# Patient Record
Sex: Female | Born: 1935 | Hispanic: Yes | Smoking: Never smoker
Health system: Southern US, Community
[De-identification: ages and names within clinical notes are randomized; demographics above are authoritative.]

## PROBLEM LIST (undated history)

## (undated) DIAGNOSIS — H269 Unspecified cataract: Secondary | ICD-10-CM

## (undated) DIAGNOSIS — I10 Essential (primary) hypertension: Secondary | ICD-10-CM

## (undated) DIAGNOSIS — A159 Respiratory tuberculosis unspecified: Secondary | ICD-10-CM

## (undated) HISTORY — DX: Unspecified cataract: H26.9

## (undated) HISTORY — DX: Essential (primary) hypertension: I10

## (undated) HISTORY — DX: Respiratory tuberculosis unspecified: A15.9

---

## 2013-09-07 ENCOUNTER — Ambulatory Visit: Payer: Self-pay | Admitting: Family Medicine

## 2015-03-05 IMAGING — CR DG CHEST 1V
1 series · 1 of 1 positions shown · non-contrast
Comparison: None.

CLINICAL DATA: Positive tuberculin skin test

EXAM:
CHEST - 1 VIEW

[w chest pa]
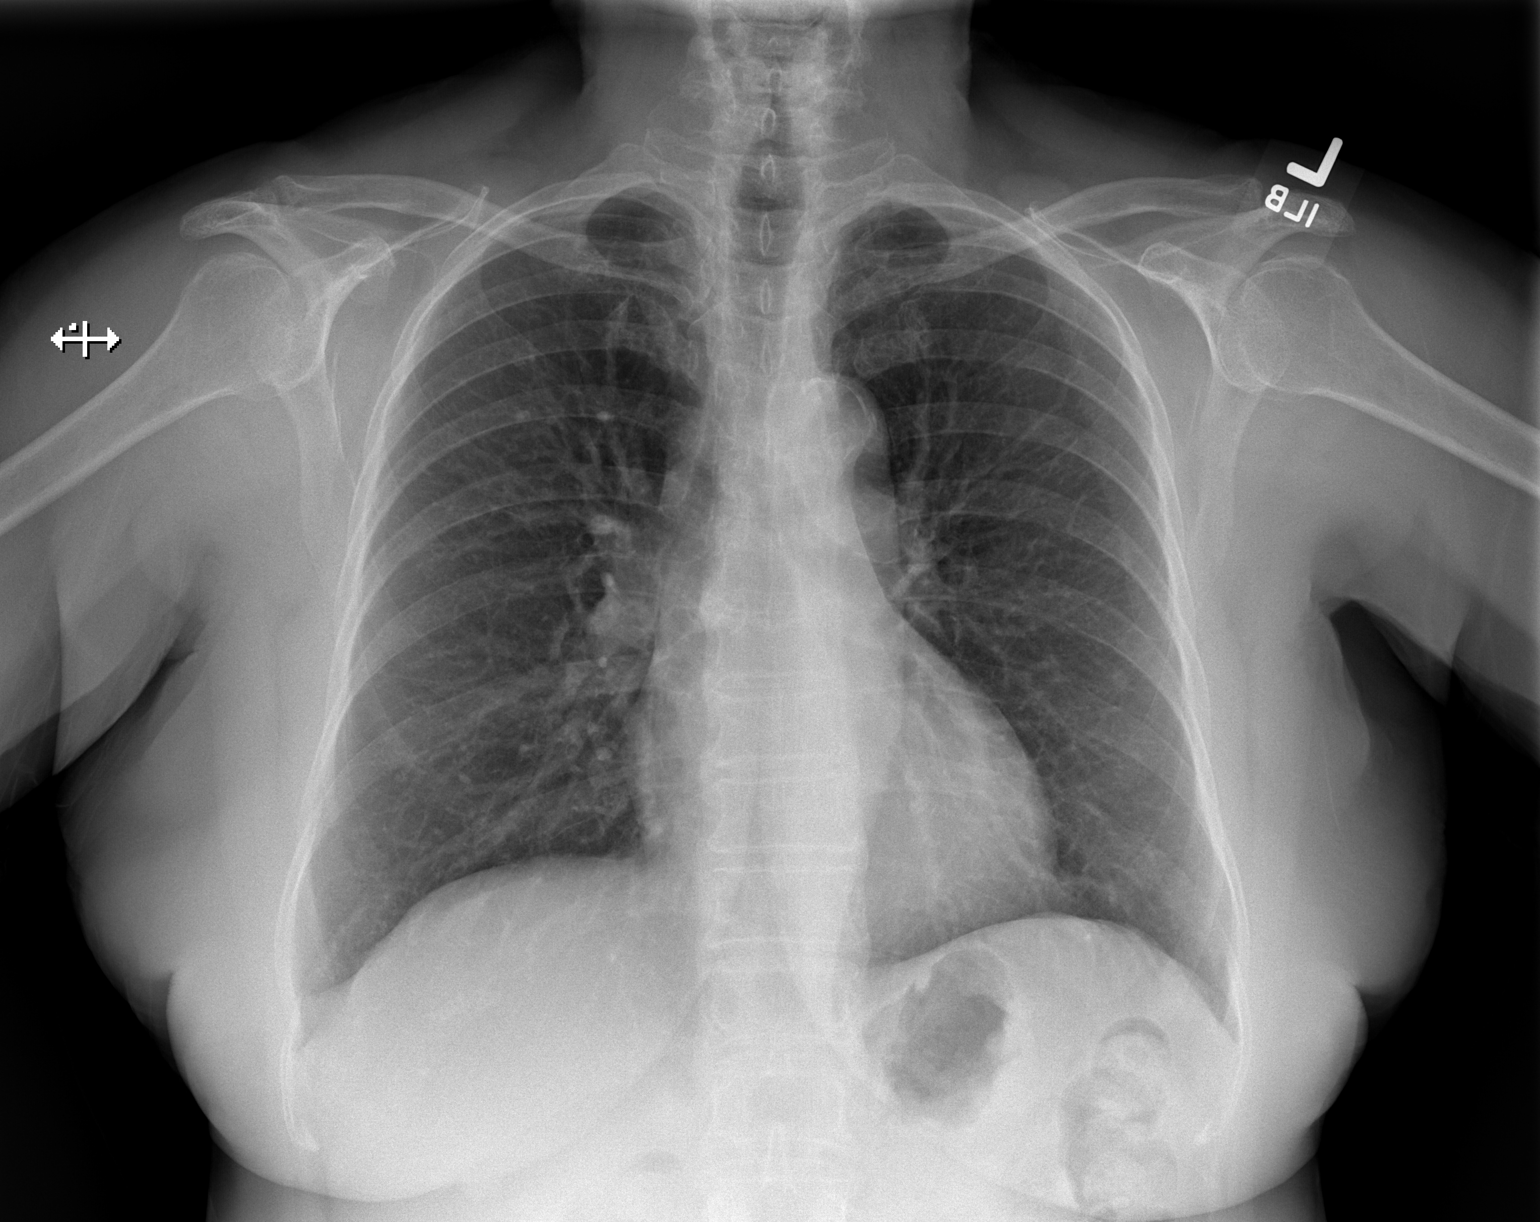

[1 of 1 positions shown; findings below may reference images not displayed]

FINDINGS: There is a small area of presumed scar in the left mid lung region
measuring 4 mm. Lungs are otherwise clear. Heart size and pulmonary
vascularity are normal. No adenopathy. There is atherosclerotic
change in the aorta. No bone lesions.
IMPRESSION: Small probable scar left mid lung. As there are no prior studies to
compare, a followup study in approximately 3 months to assess for
stability would be advisable to further evaluate. The lungs
elsewhere are clear. No adenopathy.

## 2015-03-16 ENCOUNTER — Other Ambulatory Visit: Payer: Self-pay

## 2015-03-16 LAB — HEPATIC FUNCTION PANEL
ALT: 15 U/L (ref 7–35)
AST: 18 U/L (ref 13–35)
Alkaline Phosphatase: 74 U/L (ref 25–125)
BILIRUBIN, TOTAL: 0.4 mg/dL

## 2015-03-16 LAB — BASIC METABOLIC PANEL
BUN: 22 mg/dL — AB (ref 4–21)
Creatinine: 0.8 mg/dL (ref 0.5–1.1)
GLUCOSE: 91 mg/dL
POTASSIUM: 4 mmol/L (ref 3.4–5.3)
Sodium: 140 mmol/L (ref 137–147)

## 2015-03-16 LAB — LIPID PANEL
Cholesterol: 131 mg/dL (ref 0–200)
HDL: 39 mg/dL (ref 35–70)
LDL CALC: 58 mg/dL
Triglycerides: 171 mg/dL — AB (ref 40–160)

## 2015-03-16 LAB — CBC AND DIFFERENTIAL
HCT: 41 % (ref 36–46)
Hemoglobin: 13.7 g/dL (ref 12.0–16.0)
Neutrophils Absolute: 5 /uL
Platelets: 158 10*3/uL (ref 150–399)
WBC: 9.4 10*3/mL

## 2015-03-16 LAB — HEMOGLOBIN A1C: Hemoglobin A1C: 6.4

## 2015-03-16 LAB — TSH: TSH: 3.53 u[IU]/mL (ref 0.41–5.90)

## 2015-03-23 ENCOUNTER — Ambulatory Visit: Payer: Self-pay

## 2015-03-23 DIAGNOSIS — R7303 Prediabetes: Secondary | ICD-10-CM | POA: Insufficient documentation

## 2015-09-11 DIAGNOSIS — I10 Essential (primary) hypertension: Secondary | ICD-10-CM | POA: Insufficient documentation

## 2015-09-11 DIAGNOSIS — H269 Unspecified cataract: Secondary | ICD-10-CM | POA: Insufficient documentation

## 2015-09-21 ENCOUNTER — Ambulatory Visit: Payer: Self-pay

## 2015-10-05 ENCOUNTER — Ambulatory Visit: Payer: Self-pay | Admitting: Ophthalmology

## 2016-01-31 DIAGNOSIS — I10 Essential (primary) hypertension: Secondary | ICD-10-CM

## 2016-01-31 DIAGNOSIS — H269 Unspecified cataract: Secondary | ICD-10-CM

## 2016-01-31 DIAGNOSIS — R7303 Prediabetes: Secondary | ICD-10-CM

## 2016-03-21 ENCOUNTER — Other Ambulatory Visit: Payer: Self-pay

## 2016-03-21 DIAGNOSIS — I10 Essential (primary) hypertension: Secondary | ICD-10-CM

## 2016-03-21 DIAGNOSIS — R7303 Prediabetes: Secondary | ICD-10-CM

## 2016-03-22 LAB — CBC WITH DIFFERENTIAL/PLATELET
Basophils Absolute: 0.1 10*3/uL (ref 0.0–0.2)
Basos: 1 %
EOS (ABSOLUTE): 0 10*3/uL (ref 0.0–0.4)
Eos: 0 %
HEMOGLOBIN: 14 g/dL (ref 11.1–15.9)
Hematocrit: 43.1 % (ref 34.0–46.6)
IMMATURE GRANULOCYTES: 0 %
Immature Grans (Abs): 0 10*3/uL (ref 0.0–0.1)
Lymphocytes Absolute: 3.5 10*3/uL — ABNORMAL HIGH (ref 0.7–3.1)
Lymphs: 45 %
MCH: 29.7 pg (ref 26.6–33.0)
MCHC: 32.5 g/dL (ref 31.5–35.7)
MCV: 91 fL (ref 79–97)
MONOS ABS: 0.7 10*3/uL (ref 0.1–0.9)
Monocytes: 9 %
NEUTROS PCT: 45 %
Neutrophils Absolute: 3.5 10*3/uL (ref 1.4–7.0)
Platelets: 173 10*3/uL (ref 150–379)
RBC: 4.72 x10E6/uL (ref 3.77–5.28)
RDW: 13.1 % (ref 12.3–15.4)
WBC: 7.9 10*3/uL (ref 3.4–10.8)

## 2016-03-22 LAB — COMPREHENSIVE METABOLIC PANEL
A/G RATIO: 1.6 (ref 1.2–2.2)
ALT: 25 IU/L (ref 0–32)
AST: 30 IU/L (ref 0–40)
Albumin: 4.4 g/dL (ref 3.5–4.7)
Alkaline Phosphatase: 68 IU/L (ref 39–117)
BILIRUBIN TOTAL: 0.3 mg/dL (ref 0.0–1.2)
BUN / CREAT RATIO: 32 — AB (ref 12–28)
BUN: 19 mg/dL (ref 8–27)
CALCIUM: 9.1 mg/dL (ref 8.7–10.3)
CO2: 24 mmol/L (ref 18–29)
Chloride: 94 mmol/L — ABNORMAL LOW (ref 96–106)
Creatinine, Ser: 0.6 mg/dL (ref 0.57–1.00)
GFR, EST AFRICAN AMERICAN: 98 mL/min/{1.73_m2} (ref >59)
GFR, EST NON AFRICAN AMERICAN: 85 mL/min/{1.73_m2} (ref >59)
GLOBULIN, TOTAL: 2.7 g/dL (ref 1.5–4.5)
Glucose: 93 mg/dL (ref 65–99)
POTASSIUM: 3.7 mmol/L (ref 3.5–5.2)
SODIUM: 135 mmol/L (ref 134–144)
TOTAL PROTEIN: 7.1 g/dL (ref 6.0–8.5)

## 2016-03-22 LAB — LIPID PANEL
CHOLESTEROL TOTAL: 128 mg/dL (ref 100–199)
Chol/HDL Ratio: 3.4 ratio units (ref 0.0–4.4)
HDL: 38 mg/dL — AB (ref >39)
LDL Calculated: 63 mg/dL (ref 0–99)
TRIGLYCERIDES: 134 mg/dL (ref 0–149)
VLDL Cholesterol Cal: 27 mg/dL (ref 5–40)

## 2016-03-28 ENCOUNTER — Ambulatory Visit: Payer: Self-pay | Admitting: Family Medicine

## 2016-03-28 VITALS — BP 160/73 | HR 65 | Resp 16 | Ht <= 58 in | Wt 110.0 lb

## 2016-03-28 DIAGNOSIS — N3090 Cystitis, unspecified without hematuria: Secondary | ICD-10-CM | POA: Insufficient documentation

## 2016-03-28 DIAGNOSIS — I10 Essential (primary) hypertension: Secondary | ICD-10-CM

## 2016-03-28 DIAGNOSIS — E785 Hyperlipidemia, unspecified: Secondary | ICD-10-CM

## 2016-03-28 MED ORDER — SIMVASTATIN 40 MG PO TABS: 40.0000 mg | ORAL_TABLET | Freq: Every day | ORAL | Status: DC

## 2016-03-28 MED ORDER — HYDROCHLOROTHIAZIDE 25 MG PO TABS: 25.0000 mg | ORAL_TABLET | Freq: Every day | ORAL | Status: DC

## 2016-03-28 MED ORDER — CIPROFLOXACIN HCL 250 MG PO TABS: 250.0000 mg | ORAL_TABLET | Freq: Two times a day (BID) | ORAL | Status: DC

## 2016-03-28 NOTE — Progress Notes (Signed)
       Patient: Melanie Stark Female    DOB: 27-Apr-1931   82 y.o.   MRN: [id number] Visit Date: 03/28/2016  Today's Provider: [facility]   Chief Complaint  Patient presents with  . Urinary Tract Infection  . Hypertension   Subjective:    HPI  Subjective:  Melanie Stark is a 82 y.o. female with hypertension here for recheck.   Doing well. Taking her medication regularly.  Needs medication regularly.  Feels well except as noted. Daughter here with her and is helping with translation.     Does also have some urine symptoms. Does have some frequency and burning.   No leaking. Does not have any fevers. Does not feel sick.    Current Outpatient Prescriptions  Medication Sig Dispense Refill  . hydrochlorothiazide (HYDRODIURIL) 25 MG tablet Take 25 mg by mouth daily. Take 1 and 1/2 tabs PO QD    . simvastatin (ZOCOR) 40 MG tablet Take 40 mg by mouth daily.     No current facility-administered medications for this visit.        No Known Allergies Previous Medications   HYDROCHLOROTHIAZIDE (HYDRODIURIL) 25 MG TABLET    Take 25 mg by mouth daily. Take 1 and 1/2 tabs PO QD   SIMVASTATIN (ZOCOR) 40 MG TABLET    Take 40 mg by mouth daily.    Review of Systems  Social History  Substance Use Topics  . Smoking status: Never Smoker   . Smokeless tobacco: Not on file  . Alcohol Use: No   Objective:   BP 160/73 mmHg  Pulse 65  Resp 16  Ht 4\' 8"  (1.422 m)  Wt 110 lb (49.896 kg)  BMI 24.68 kg/m2  LMP  (LMP Unknown)  Physical Exam  Constitutional: She is oriented to person, place, and time. She appears well-developed and well-nourished.  Cardiovascular: Normal rate and regular rhythm.   Pulmonary/Chest: Effort normal and breath sounds normal.  Abdominal: Soft. Bowel sounds are normal. There is no tenderness. There is no rebound and no guarding.  Neurological: She is alert and oriented to person, place, and time.      Assessment & Plan:     1. Essential  hypertension Condition is stable. Please continue current medication and  plan of care as noted.  Up slightly today. Will adjust if needed.   - hydrochlorothiazide (HYDRODIURIL) 25 MG tablet; Take 1 tablet (25 mg total) by mouth daily. Take 1 and 1/2 tabs PO QD  Dispense: 135 tablet; Refill: 3  2. Hyperlipemia Condition is stable. Please continue current medication and  plan of care as noted.   - simvastatin (ZOCOR) 40 MG tablet; Take 1 tablet (40 mg total) by mouth daily.  Dispense: 90 tablet; Refill: 3  3. Cystitis New problem. Will send for culture. Treat today.  Patient instructed to call back if condition worsens or does not improve.    - ciprofloxacin (CIPRO) 250 MG tablet; Take 1 tablet (250 mg total) by mouth 2 (two) times daily.  Dispense: 10 tablet; Refill: 0 - CULTURE, URINE COMPREHENSIVE     Lorie PhenixNancy Jonie Burdell, MD   [facility]   [facility]

## 2016-04-03 LAB — CULTURE, URINE COMPREHENSIVE

## 2016-04-05 ENCOUNTER — Telehealth: Payer: Self-pay

## 2016-04-05 DIAGNOSIS — N3000 Acute cystitis without hematuria: Secondary | ICD-10-CM

## 2016-04-05 MED ORDER — SULFAMETHOXAZOLE-TRIMETHOPRIM 800-160 MG PO TABS: 1.0000 | ORAL_TABLET | Freq: Two times a day (BID) | ORAL | Status: DC

## 2016-04-09 NOTE — Telephone Encounter (Signed)
That's fine

## 2016-04-09 NOTE — Telephone Encounter (Signed)
Spoke with patients daughter she states the patient was given an antibiotic at her 03/28/16 visit and she is feeling better. I [facility] to [facility] antibiotic [facility] on Friday and it did not go through. If she still needs another round of antibiotics [facility] [place].

## 2016-05-16 ENCOUNTER — Ambulatory Visit: Payer: Self-pay

## 2016-05-30 ENCOUNTER — Ambulatory Visit: Payer: Self-pay | Admitting: Family Medicine

## 2016-05-30 VITALS — BP 140/75 | Wt 110.0 lb

## 2016-05-30 DIAGNOSIS — M1710 Unilateral primary osteoarthritis, unspecified knee: Secondary | ICD-10-CM

## 2016-05-30 MED ORDER — MELOXICAM 7.5 MG PO TABS: 7.5000 mg | ORAL_TABLET | Freq: Every day | ORAL | 1 refills | Status: DC

## 2016-05-30 MED ORDER — OMEPRAZOLE 20 MG PO CPDR: 20.0000 mg | DELAYED_RELEASE_CAPSULE | Freq: Every day | ORAL | 1 refills | Status: DC

## 2016-05-30 NOTE — Progress Notes (Signed)
   Subjective:    Patient ID: Melanie Stark, female    DOB: Jan 19, 1931, 82 y.o.   MRN: [id number]  HPI  Patient had uti and took full antibiotic course in past 3 months Possible tendinopathy in knees Also pain in legs Takes ibuprofen for pain   Patient Active Problem List   Diagnosis Date Noted  . Hyperlipemia 03/28/2016  . Cystitis 03/28/2016  . Cataracts, bilateral 09/11/2015  . Hypertension 09/11/2015  . Prediabetes 03/23/2015        Review of Systems  Constitutional: Negative.   Respiratory: Negative.   Cardiovascular: Negative.   Endocrine: Negative.   Musculoskeletal: Positive for arthralgias and joint swelling.  Psychiatric/Behavioral: Negative.        Objective:   Physical Exam  Constitutional: She appears well-developed and well-nourished.  HENT:  Head: Normocephalic and atraumatic.  Eyes: Conjunctivae are normal. No scleral icterus.  Cardiovascular: Normal rate, regular rhythm and normal heart sounds.   Pulmonary/Chest: Effort normal and breath sounds normal.  Abdominal: Soft.  Skin: Skin is warm and dry.  Psychiatric: She has a normal mood and affect. Her behavior is normal. Judgment and thought content normal.    BP 140/75   Wt 110 lb (49.9 kg)   LMP  (LMP Unknown)   BMI 24.66 kg/m   Pain mostly in left knee     Assessment & Plan:  Knee pain/OA Added medications: meloxican for 1-2 months. Omeprazole for GI protection. Prediabetes Hypertension Hyperlipidemia I have done the exam and reviewed the above chart and it is accurate to the best of my knowledge. Fidela Juneau MD Follow up PRN

## 2016-05-30 NOTE — Patient Instructions (Signed)
Follow up PRN

## 2016-09-19 ENCOUNTER — Ambulatory Visit: Payer: Self-pay | Admitting: Adult Health Nurse Practitioner

## 2016-09-19 VITALS — BP 133/65 | HR 77 | Temp 97.9°F | Resp 16 | Ht <= 58 in | Wt 108.0 lb

## 2016-09-19 DIAGNOSIS — E785 Hyperlipidemia, unspecified: Secondary | ICD-10-CM

## 2016-09-19 DIAGNOSIS — I10 Essential (primary) hypertension: Secondary | ICD-10-CM

## 2016-09-19 DIAGNOSIS — Z0000 Encounter for general adult medical examination without abnormal findings: Secondary | ICD-10-CM

## 2016-09-19 MED ORDER — SIMVASTATIN 40 MG PO TABS: 40.0000 mg | ORAL_TABLET | Freq: Every day | ORAL | 3 refills | Status: DC

## 2016-09-19 MED ORDER — HYDROCHLOROTHIAZIDE 25 MG PO TABS: 25.0000 mg | ORAL_TABLET | Freq: Every day | ORAL | 3 refills | Status: DC

## 2016-09-19 NOTE — Progress Notes (Signed)
  Patient: Melanie NeighborsMaria Stark Female    DOB: Mar 29, 1931   82 y.o.   MRN: [id number] Visit Date: 09/19/2016  Today's Provider: Jacelyn Pieah Doles-Johnson, NP   No chief complaint on file.  Subjective:    HPI  HLD:  On Zocor 40mg  daily.  Denies myalgias or abdominal pain.   HTN:  On HCTZ 25 daily.  BP at home 130-135 SBP.  Denies HA, dizziness, CP.     No Known Allergies Previous Medications   HYDROCHLOROTHIAZIDE (HYDRODIURIL) 25 MG TABLET    Take 1 tablet (25 mg total) by mouth daily. Take 1 and 1/2 tabs PO QD   SIMVASTATIN (ZOCOR) 40 MG TABLET    Take 1 tablet (40 mg total) by mouth daily.    Review of Systems  All other systems reviewed and are negative.   Social History  Substance Use Topics  . Smoking status: Never Smoker  . Smokeless tobacco: Never Used  . Alcohol use No   Objective:   BP 133/65   Pulse 77   Temp 97.9 F (36.6 C) (Oral)   Resp 16   Ht 4\' 10"  (1.473 m)   Wt 108 lb (49 kg)   LMP  (LMP Unknown)   BMI 22.57 kg/m   Physical Exam  Constitutional: She is oriented to person, place, and time. She appears well-developed and well-nourished.  HENT:  Head: Normocephalic and atraumatic.  Eyes: Pupils are equal, round, and reactive to light.  Neck: Normal range of motion. Neck supple.  Cardiovascular: Normal rate, regular rhythm and normal heart sounds.   Pulmonary/Chest: Effort normal and breath sounds normal.  Abdominal: Soft. Bowel sounds are normal.  Neurological: She is alert and oriented to person, place, and time.  Skin: Skin is warm and dry.  Psychiatric: She has a normal mood and affect.  Vitals reviewed.       Assessment & Plan:     Order routine labs tonight.   HTN:  Controlled.  Goal BP <150/90.  Continue current medication regimen.  Encourage low salt diet and exercise.   HLD:  Will check lipid panel and LFTs tonight.  Continue current regimen.  Encourage low cholesterol, low fat diet and exercise.   FU in 6 months.       Jacelyn Pieah  Doles-Johnson, NP   [facility]

## 2016-09-20 LAB — CBC
HEMOGLOBIN: 14.4 g/dL (ref 11.1–15.9)
Hematocrit: 42.9 % (ref 34.0–46.6)
MCH: 30.4 pg (ref 26.6–33.0)
MCHC: 33.6 g/dL (ref 31.5–35.7)
MCV: 91 fL (ref 79–97)
Platelets: 163 10*3/uL (ref 150–379)
RBC: 4.74 x10E6/uL (ref 3.77–5.28)
RDW: 13 % (ref 12.3–15.4)
WBC: 7.2 10*3/uL (ref 3.4–10.8)

## 2016-09-20 LAB — LIPID PANEL
CHOLESTEROL TOTAL: 144 mg/dL (ref 100–199)
Chol/HDL Ratio: 3.3 ratio units (ref 0.0–4.4)
HDL: 43 mg/dL (ref >39)
LDL CALC: 69 mg/dL (ref 0–99)
TRIGLYCERIDES: 160 mg/dL — AB (ref 0–149)
VLDL Cholesterol Cal: 32 mg/dL (ref 5–40)

## 2016-09-20 LAB — COMPREHENSIVE METABOLIC PANEL
A/G RATIO: 1.7 (ref 1.2–2.2)
ALK PHOS: 95 IU/L (ref 39–117)
ALT: 18 IU/L (ref 0–32)
AST: 25 IU/L (ref 0–40)
Albumin: 4.7 g/dL (ref 3.5–4.7)
BILIRUBIN TOTAL: 0.4 mg/dL (ref 0.0–1.2)
BUN/Creatinine Ratio: 23 (ref 12–28)
BUN: 18 mg/dL (ref 8–27)
CHLORIDE: 100 mmol/L (ref 96–106)
CO2: 21 mmol/L (ref 18–29)
Calcium: 9.6 mg/dL (ref 8.7–10.3)
Creatinine, Ser: 0.79 mg/dL (ref 0.57–1.00)
GFR calc non Af Amer: 69 mL/min/{1.73_m2} (ref >59)
GFR, EST AFRICAN AMERICAN: 80 mL/min/{1.73_m2} (ref >59)
Globulin, Total: 2.7 g/dL (ref 1.5–4.5)
Glucose: 93 mg/dL (ref 65–99)
POTASSIUM: 4.7 mmol/L (ref 3.5–5.2)
Sodium: 141 mmol/L (ref 134–144)
TOTAL PROTEIN: 7.4 g/dL (ref 6.0–8.5)

## 2016-09-20 LAB — HEMOGLOBIN A1C
Est. average glucose Bld gHb Est-mCnc: 126 mg/dL
Hgb A1c MFr Bld: 6 % — ABNORMAL HIGH (ref 4.8–5.6)

## 2016-09-20 LAB — TSH: TSH: 3.64 u[IU]/mL (ref 0.450–4.500)

## 2017-03-20 ENCOUNTER — Ambulatory Visit: Payer: Self-pay | Admitting: Adult Health Nurse Practitioner

## 2017-03-20 VITALS — BP 153/68 | HR 65 | Temp 98.2°F | Wt 107.8 lb

## 2017-03-20 DIAGNOSIS — E785 Hyperlipidemia, unspecified: Secondary | ICD-10-CM

## 2017-03-20 DIAGNOSIS — I10 Essential (primary) hypertension: Secondary | ICD-10-CM

## 2017-03-20 MED ORDER — SIMVASTATIN 40 MG PO TABS: 40.0000 mg | ORAL_TABLET | Freq: Every day | ORAL | 3 refills | Status: DC

## 2017-03-20 MED ORDER — HYDROCHLOROTHIAZIDE 25 MG PO TABS: 25.0000 mg | ORAL_TABLET | Freq: Every day | ORAL | 3 refills | Status: DC

## 2017-03-20 NOTE — Addendum Note (Signed)
Addended by: Orvis BrillGREEN, Westin Knotts M on: 03/20/2017 06:23 PM   Modules accepted: Orders

## 2017-03-20 NOTE — Progress Notes (Signed)
  Patient: Melanie NeighborsMaria Stark Female    DOB: [birth year]   83 y.o.   MRN: [id number] Visit Date: 03/20/2017  Today's Provider: [facility]   Chief Complaint  Patient presents with  . Follow-up    6 months    Subjective:    HPI  HTN:  BP elevated today. Family believes pt is only taking 1 tab of HCTZ and not the 1/2 tab.  Last BP was 133/65.  BP at home running 148-150 SBP per family.   HLD:  Taking medications as directed.  Last LDL 69 in Nov 2017.      No Known Allergies Previous Medications   HYDROCHLOROTHIAZIDE (HYDRODIURIL) 25 MG TABLET    Take 1 tablet (25 mg total) by mouth daily. Take 1 and 1/2 tabs PO QD   SIMVASTATIN (ZOCOR) 40 MG TABLET    Take 1 tablet (40 mg total) by mouth daily.    Review of Systems  All other systems reviewed and are negative.   Social History  Substance Use Topics  . Smoking status: Never Smoker  . Smokeless tobacco: Never Used  . Alcohol use No   Objective:   BP (!) 153/68   Pulse 65   Temp 98.2 F (36.8 C)   Wt 107 lb 12.8 oz (48.9 kg)   LMP  (LMP Unknown)   BMI 22.53 kg/m   Physical Exam  Constitutional: She appears well-developed and well-nourished.  HENT:  Head: Normocephalic and atraumatic.  Neck: Normal range of motion. Neck supple.  Cardiovascular: Normal rate, regular rhythm and normal heart sounds.   Pulmonary/Chest: Effort normal and breath sounds normal.  Abdominal: Soft. Bowel sounds are normal.  Skin: Skin is warm and dry.  Vitals reviewed.       Assessment & Plan:         HLD:  Controlled.  Continue current regimen.  Encourage low cholesterol, low fat diet and exercise.    HTN:  Borderline.  Goal BP <150/90.  Continue current medication regimen.  Encourage low salt diet and exercise.  Discussed need to call if SBP sustained >160 and taking the correct dosage of medications.        [facility]   [facility]

## 2017-03-21 LAB — COMPREHENSIVE METABOLIC PANEL
ALBUMIN: 4.4 g/dL (ref 3.5–4.7)
ALT: 25 IU/L (ref 0–32)
AST: 26 IU/L (ref 0–40)
Albumin/Globulin Ratio: 1.7 (ref 1.2–2.2)
Alkaline Phosphatase: 79 IU/L (ref 39–117)
BUN / CREAT RATIO: 27 (ref 12–28)
BUN: 20 mg/dL (ref 8–27)
Bilirubin Total: 0.5 mg/dL (ref 0.0–1.2)
CO2: 27 mmol/L (ref 18–29)
CREATININE: 0.73 mg/dL (ref 0.57–1.00)
Calcium: 9.6 mg/dL (ref 8.7–10.3)
Chloride: 97 mmol/L (ref 96–106)
GFR calc Af Amer: 88 mL/min/{1.73_m2} (ref >59)
GFR calc non Af Amer: 76 mL/min/{1.73_m2} (ref >59)
GLOBULIN, TOTAL: 2.6 g/dL (ref 1.5–4.5)
Glucose: 105 mg/dL — ABNORMAL HIGH (ref 65–99)
Potassium: 3.8 mmol/L (ref 3.5–5.2)
SODIUM: 141 mmol/L (ref 134–144)
Total Protein: 7 g/dL (ref 6.0–8.5)

## 2017-09-18 ENCOUNTER — Other Ambulatory Visit: Payer: Self-pay

## 2017-09-18 ENCOUNTER — Ambulatory Visit: Payer: Self-pay | Admitting: Family Medicine

## 2017-09-18 VITALS — BP 140/60 | HR 64 | Temp 97.5°F | Wt 109.6 lb

## 2017-09-18 DIAGNOSIS — E785 Hyperlipidemia, unspecified: Secondary | ICD-10-CM

## 2017-09-18 DIAGNOSIS — Z09 Encounter for follow-up examination after completed treatment for conditions other than malignant neoplasm: Secondary | ICD-10-CM

## 2017-09-18 DIAGNOSIS — I10 Essential (primary) hypertension: Secondary | ICD-10-CM

## 2017-09-18 MED ORDER — HYDROCHLOROTHIAZIDE 25 MG PO TABS: 25.0000 mg | ORAL_TABLET | Freq: Every day | ORAL | 3 refills | Status: DC

## 2017-09-18 MED ORDER — SIMVASTATIN 40 MG PO TABS: 40.0000 mg | ORAL_TABLET | Freq: Every day | ORAL | 3 refills | Status: DC

## 2017-09-18 NOTE — Progress Notes (Signed)
  Patient: Melanie NeighborsMaria Stark Female    DOB: [birth year]   83 y.o.   MRN: [id number] Visit Date: 09/18/2017  Today's Provider: Kallie LocksNatalie M Jemina Scahill, FNP   Chief Complaint  Patient presents with  . Follow-up    for hypertension    Subjective:    HPI: Patient is here today for follow up in Hypertension and Hyperlipidemia. She states that she is doing well with no complaints. Continues to eat a healthy diet. No complaints of shortness of breath, cough, chest pain. Denies abdominal pain, diarrhea, constipation, nausea, vomiting, blood in stool, or hematuria.  Denies fevers, night sweats, and unintentional weight loss.     No Known Allergies  Current Outpatient Medications:  .  hydrochlorothiazide (HYDRODIURIL) 25 MG tablet, Take 1 tablet (25 mg total) daily by mouth. Take 1 and 1/2 tabs PO QD, Disp: 135 tablet, Rfl: 3 .  simvastatin (ZOCOR) 40 MG tablet, Take 1 tablet (40 mg total) daily by mouth., Disp: 90 tablet, Rfl: 3  Review of Systems  Constitutional: Negative.   HENT: Negative.   Eyes: Negative.   Respiratory: Negative.   Cardiovascular: Negative.   Gastrointestinal: Negative.   Endocrine: Negative.   Genitourinary: Negative.   Musculoskeletal: Negative.   Skin: Negative.   Allergic/Immunologic: Negative.   Neurological: Negative.   Hematological: Negative.   Psychiatric/Behavioral: Negative.   All other systems reviewed and are negative.     Social History   Tobacco Use  . Smoking status: Never Smoker  . Smokeless tobacco: Never Used  Substance Use Topics  . Alcohol use: No   Objective:   BP 140/60   Pulse 64   Temp (!) 97.5 F (36.4 C)   Wt 109 lb 9.6 oz (49.7 kg)   LMP  (LMP Unknown)   BMI 22.91 kg/m   Physical Exam  Constitutional: She is oriented to person, place, and time. She appears well-developed and well-nourished.  HENT:  Head: Normocephalic and atraumatic.  Right Ear: External ear normal.  Left Ear: External ear normal.  Mouth/Throat: Oropharynx  is clear and moist.  Neck: Normal range of motion.  Cardiovascular: Normal rate, regular rhythm, normal heart sounds and intact distal pulses.  Pulmonary/Chest: Effort normal and breath sounds normal.  Abdominal: Soft. Bowel sounds are normal.  Musculoskeletal: Normal range of motion.  Neurological: She is alert and oriented to person, place, and time.  Skin: Skin is warm and dry.  Psychiatric: She has a normal mood and affect. Her behavior is normal. Judgment and thought content normal.  Nursing note and vitals reviewed.       Assessment & Plan:   1. Hypertension: Stable.  BP is 140/60 today. Continue healthy diet, exercise. CBC, CMET, Lipid panels drawn today. Will call patient if abnormal.   2. Hyperlipidemia: See # 1.  3. Follow up: 6 months OV/Labs Patient to call office sooner if needed.     Kallie LocksNatalie M Taliya Mcclard, FNP   [facility]

## 2017-09-22 LAB — CBC
HEMOGLOBIN: 14.6 g/dL (ref 11.1–15.9)
Hematocrit: 42 % (ref 34.0–46.6)
MCH: 31.1 pg (ref 26.6–33.0)
MCHC: 34.8 g/dL (ref 31.5–35.7)
MCV: 89 fL (ref 79–97)
PLATELETS: 174 10*3/uL (ref 150–379)
RBC: 4.7 x10E6/uL (ref 3.77–5.28)
RDW: 13.2 % (ref 12.3–15.4)
WBC: 8.3 10*3/uL (ref 3.4–10.8)

## 2017-09-22 LAB — COMPREHENSIVE METABOLIC PANEL
ALT: 18 IU/L (ref 0–32)
AST: 24 IU/L (ref 0–40)
Albumin/Globulin Ratio: 1.6 (ref 1.2–2.2)
Albumin: 4.5 g/dL (ref 3.5–4.7)
Alkaline Phosphatase: 82 IU/L (ref 39–117)
BUN/Creatinine Ratio: 24 (ref 12–28)
BUN: 21 mg/dL (ref 8–27)
Bilirubin Total: 0.4 mg/dL (ref 0.0–1.2)
CALCIUM: 9.8 mg/dL (ref 8.7–10.3)
CHLORIDE: 98 mmol/L (ref 96–106)
CO2: 26 mmol/L (ref 20–29)
CREATININE: 0.88 mg/dL (ref 0.57–1.00)
GFR, EST AFRICAN AMERICAN: 70 mL/min/{1.73_m2} (ref >59)
GFR, EST NON AFRICAN AMERICAN: 61 mL/min/{1.73_m2} (ref >59)
GLUCOSE: 103 mg/dL — AB (ref 65–99)
Globulin, Total: 2.9 g/dL (ref 1.5–4.5)
Potassium: 4 mmol/L (ref 3.5–5.2)
Sodium: 140 mmol/L (ref 134–144)
TOTAL PROTEIN: 7.4 g/dL (ref 6.0–8.5)

## 2017-09-23 LAB — LIPID PANEL
Chol/HDL Ratio: 3 ratio (ref 0.0–4.4)
Cholesterol, Total: 134 mg/dL (ref 100–199)
HDL: 44 mg/dL (ref >39)
LDL Calculated: 68 mg/dL (ref 0–99)
Triglycerides: 109 mg/dL (ref 0–149)
VLDL Cholesterol Cal: 22 mg/dL (ref 5–40)

## 2018-03-12 ENCOUNTER — Other Ambulatory Visit: Payer: Self-pay

## 2018-03-12 DIAGNOSIS — Z0000 Encounter for general adult medical examination without abnormal findings: Secondary | ICD-10-CM

## 2018-03-13 LAB — CBC
HEMATOCRIT: 41.2 % (ref 34.0–46.6)
Hemoglobin: 14.2 g/dL (ref 11.1–15.9)
MCH: 30.9 pg (ref 26.6–33.0)
MCHC: 34.5 g/dL (ref 31.5–35.7)
MCV: 90 fL (ref 79–97)
PLATELETS: 155 10*3/uL (ref 150–379)
RBC: 4.6 x10E6/uL (ref 3.77–5.28)
RDW: 13.2 % (ref 12.3–15.4)
WBC: 8.1 10*3/uL (ref 3.4–10.8)

## 2018-03-13 LAB — COMPREHENSIVE METABOLIC PANEL
ALK PHOS: 83 IU/L (ref 39–117)
ALT: 15 IU/L (ref 0–32)
AST: 22 IU/L (ref 0–40)
Albumin/Globulin Ratio: 1.7 (ref 1.2–2.2)
Albumin: 4.4 g/dL (ref 3.5–4.7)
BILIRUBIN TOTAL: 0.3 mg/dL (ref 0.0–1.2)
BUN / CREAT RATIO: 22 (ref 12–28)
BUN: 15 mg/dL (ref 8–27)
CHLORIDE: 93 mmol/L — AB (ref 96–106)
CO2: 25 mmol/L (ref 20–29)
CREATININE: 0.68 mg/dL (ref 0.57–1.00)
Calcium: 9.1 mg/dL (ref 8.7–10.3)
GFR calc Af Amer: 93 mL/min/{1.73_m2} (ref >59)
GFR calc non Af Amer: 81 mL/min/{1.73_m2} (ref >59)
GLUCOSE: 89 mg/dL (ref 65–99)
Globulin, Total: 2.6 g/dL (ref 1.5–4.5)
Potassium: 3.4 mmol/L — ABNORMAL LOW (ref 3.5–5.2)
SODIUM: 133 mmol/L — AB (ref 134–144)
Total Protein: 7 g/dL (ref 6.0–8.5)

## 2018-03-13 LAB — LIPID PANEL
Chol/HDL Ratio: 3.2 ratio (ref 0.0–4.4)
Cholesterol, Total: 107 mg/dL (ref 100–199)
HDL: 33 mg/dL — AB (ref >39)
LDL Calculated: 26 mg/dL (ref 0–99)
TRIGLYCERIDES: 240 mg/dL — AB (ref 0–149)
VLDL Cholesterol Cal: 48 mg/dL — ABNORMAL HIGH (ref 5–40)

## 2018-03-13 LAB — HEMOGLOBIN A1C
Est. average glucose Bld gHb Est-mCnc: 128 mg/dL
HEMOGLOBIN A1C: 6.1 % — AB (ref 4.8–5.6)

## 2018-03-13 LAB — TSH: TSH: 4.5 u[IU]/mL (ref 0.450–4.500)

## 2018-03-19 ENCOUNTER — Ambulatory Visit: Payer: Self-pay | Admitting: Adult Health Nurse Practitioner

## 2018-03-19 VITALS — BP 172/66 | HR 64 | Temp 98.1°F | Wt 107.8 lb

## 2018-03-19 DIAGNOSIS — E785 Hyperlipidemia, unspecified: Secondary | ICD-10-CM

## 2018-03-19 DIAGNOSIS — I10 Essential (primary) hypertension: Secondary | ICD-10-CM

## 2018-03-19 DIAGNOSIS — N3090 Cystitis, unspecified without hematuria: Secondary | ICD-10-CM

## 2018-03-19 MED ORDER — CEPHALEXIN 500 MG PO CAPS: 500.0000 mg | ORAL_CAPSULE | Freq: Two times a day (BID) | ORAL | 0 refills | Status: DC

## 2018-03-19 MED ORDER — HYDROCHLOROTHIAZIDE 25 MG PO TABS: 25.0000 mg | ORAL_TABLET | Freq: Every day | ORAL | 3 refills | Status: DC

## 2018-03-19 MED ORDER — SIMVASTATIN 40 MG PO TABS: 40.0000 mg | ORAL_TABLET | Freq: Every day | ORAL | 3 refills | Status: DC

## 2018-03-19 NOTE — Progress Notes (Signed)
   Subjective:    Patient ID: Melanie Stark, female    DOB: [birth year], 84 y.o.   MRN: [id number]  HPI  Glynn Freas is a 84 yo F here for 6 mo f/u for HTN. She is here with a family member who is interpreting. She presents with complaints of symptoms for UTI.  UTI: pain with urination and frequent urination for 1 week. She denies bleeding and fever. Pt tried Azo OTC with minimal relief HTN: last visit it was well controlled but tonight it is elevated at 172/66.   Patient Active Problem List   Diagnosis Date Noted  . Hyperlipemia 03/28/2016  . Cystitis 03/28/2016  . Cataracts, bilateral 09/11/2015  . Hypertension 09/11/2015  . Prediabetes 03/23/2015   Allergies as of 03/19/2018   No Known Allergies     Medication List        Accurate as of 03/19/18  6:09 PM. Always use your most recent med list.          hydrochlorothiazide 25 MG tablet Commonly known as:  HYDRODIURIL Take 1 tablet (25 mg total) daily by mouth. Take 1 and 1/2 tabs PO QD   simvastatin 40 MG tablet Commonly known as:  ZOCOR Take 1 tablet (40 mg total) daily by mouth.        Review of Systems I reviewed all labs. A1C is at 6.1. All other systems negative.     Objective:   Physical Exam  Constitutional: She appears well-developed and well-nourished.  Cardiovascular: Normal rate, regular rhythm, normal heart sounds and intact distal pulses.  Pulmonary/Chest: Effort normal and breath sounds normal.  Abdominal: Soft. Bowel sounds are normal.  Musculoskeletal: She exhibits no edema.  Vitals reviewed.  No CVAT   BP (!) 172/66   Pulse 64   Temp 98.1 F (36.7 C) (Oral)   Wt 107 lb 12.8 oz (48.9 kg)   LMP  (LMP Unknown)   BMI 22.53 kg/m         Assessment & Plan:   UTI: UA sample tonight to determine dx.  Rx for Keflex BID x 5 days.  Refilled medications.  Dx of Prediabetes for A1C of 6.1. No medications.  F/u 2 week to review BP and UA results. 6 mo routine care with lab 1 week prior.

## 2018-03-19 NOTE — Addendum Note (Signed)
Addended by: Orvis Brill on: 03/19/2018 06:21 PM   Modules accepted: Orders

## 2018-03-20 LAB — LIPID PANEL
CHOL/HDL RATIO: 3.1 ratio (ref 0.0–4.4)
Cholesterol, Total: 128 mg/dL (ref 100–199)
HDL: 41 mg/dL (ref >39)
LDL CALC: 66 mg/dL (ref 0–99)
TRIGLYCERIDES: 106 mg/dL (ref 0–149)
VLDL Cholesterol Cal: 21 mg/dL (ref 5–40)

## 2018-03-20 LAB — MICROSCOPIC EXAMINATION
CASTS: NONE SEEN /LPF
EPITHELIAL CELLS (NON RENAL): NONE SEEN /HPF (ref 0–10)

## 2018-03-20 LAB — COMPREHENSIVE METABOLIC PANEL
ALK PHOS: 93 IU/L (ref 39–117)
ALT: 18 IU/L (ref 0–32)
AST: 23 IU/L (ref 0–40)
Albumin/Globulin Ratio: 1.6 (ref 1.2–2.2)
Albumin: 4.6 g/dL (ref 3.5–4.7)
BUN/Creatinine Ratio: 24 (ref 12–28)
BUN: 20 mg/dL (ref 8–27)
Bilirubin Total: 0.3 mg/dL (ref 0.0–1.2)
CALCIUM: 9 mg/dL (ref 8.7–10.3)
CO2: 26 mmol/L (ref 20–29)
CREATININE: 0.82 mg/dL (ref 0.57–1.00)
Chloride: 97 mmol/L (ref 96–106)
GFR calc Af Amer: 76 mL/min/{1.73_m2} (ref >59)
GFR, EST NON AFRICAN AMERICAN: 66 mL/min/{1.73_m2} (ref >59)
GLUCOSE: 87 mg/dL (ref 65–99)
Globulin, Total: 2.8 g/dL (ref 1.5–4.5)
Potassium: 3.2 mmol/L — ABNORMAL LOW (ref 3.5–5.2)
Sodium: 136 mmol/L (ref 134–144)
Total Protein: 7.4 g/dL (ref 6.0–8.5)

## 2018-03-20 LAB — URINALYSIS, ROUTINE W REFLEX MICROSCOPIC
Bilirubin, UA: NEGATIVE
Glucose, UA: NEGATIVE
Ketones, UA: NEGATIVE
Nitrite, UA: POSITIVE — AB
PH UA: 7 (ref 5.0–7.5)
Protein, UA: NEGATIVE
RBC, UA: NEGATIVE
Specific Gravity, UA: 1.005 (ref 1.005–1.030)
Urobilinogen, Ur: 0.2 mg/dL (ref 0.2–1.0)

## 2018-04-02 ENCOUNTER — Ambulatory Visit: Payer: Self-pay | Admitting: Adult Health Nurse Practitioner

## 2018-04-02 VITALS — BP 144/62 | HR 60 | Temp 98.4°F | Ht <= 58 in | Wt 107.1 lb

## 2018-04-02 DIAGNOSIS — I10 Essential (primary) hypertension: Secondary | ICD-10-CM

## 2018-04-02 DIAGNOSIS — N3090 Cystitis, unspecified without hematuria: Secondary | ICD-10-CM

## 2018-04-02 NOTE — Progress Notes (Signed)
  Patient: Melanie Stark Female    DOB: 1931/05/15   84 y.o.   MRN: [id number] Visit Date: 04/02/2018  Today's Provider: Jacelyn Pi, NP   Chief Complaint  Patient presents with  . Follow-up    UTI - symptoms improved    Subjective:    HPI   UTI symptoms have resolved after course of Keflex.    No Known Allergies Previous Medications   CEPHALEXIN (KEFLEX) 500 MG CAPSULE    Take 1 capsule (500 mg total) by mouth 2 (two) times daily.   HYDROCHLOROTHIAZIDE (HYDRODIURIL) 25 MG TABLET    Take 1 tablet (25 mg total) by mouth daily. Take 1 and 1/2 tabs PO QD   SIMVASTATIN (ZOCOR) 40 MG TABLET    Take 1 tablet (40 mg total) by mouth daily.    Review of Systems  All other systems reviewed and are negative.   Social History   Tobacco Use  . Smoking status: Never Smoker  . Smokeless tobacco: Never Used  Substance Use Topics  . Alcohol use: No   Objective:   BP (!) 144/62   Pulse 60   Temp 98.4 F (36.9 C)   Ht  (1.422 m)   Wt 107 lb 1.6 oz (48.6 kg)   LMP  (LMP Unknown)   BMI 24.01 kg/m   Physical Exam  Constitutional: She appears well-developed and well-nourished.  Cardiovascular: Normal rate and regular rhythm.  Pulmonary/Chest: Effort normal and breath sounds normal.  Abdominal: Soft. Bowel sounds are normal. There is no tenderness. There is no CVA tenderness.  Vitals reviewed.       Assessment & Plan:        FU as previously directed or as needed.    Jacelyn Pi, NP   [facility]

## 2018-09-22 ENCOUNTER — Other Ambulatory Visit: Payer: Self-pay

## 2018-09-29 ENCOUNTER — Ambulatory Visit: Payer: Self-pay

## 2019-08-19 ENCOUNTER — Other Ambulatory Visit: Payer: Self-pay

## 2019-08-25 ENCOUNTER — Other Ambulatory Visit: Payer: Self-pay

## 2019-08-25 DIAGNOSIS — I10 Essential (primary) hypertension: Secondary | ICD-10-CM

## 2019-08-26 ENCOUNTER — Other Ambulatory Visit: Payer: Self-pay

## 2019-08-26 DIAGNOSIS — I10 Essential (primary) hypertension: Secondary | ICD-10-CM

## 2019-08-27 LAB — URINALYSIS, ROUTINE W REFLEX MICROSCOPIC
Bilirubin, UA: NEGATIVE
Glucose, UA: NEGATIVE
Ketones, UA: NEGATIVE
Nitrite, UA: NEGATIVE
Protein,UA: NEGATIVE
Specific Gravity, UA: 1.006 (ref 1.005–1.030)
Urobilinogen, Ur: 0.2 mg/dL (ref 0.2–1.0)
pH, UA: 8 — ABNORMAL HIGH (ref 5.0–7.5)

## 2019-08-27 LAB — COMPREHENSIVE METABOLIC PANEL
ALT: 17 IU/L (ref 0–32)
AST: 28 IU/L (ref 0–40)
Albumin/Globulin Ratio: 1.3 (ref 1.2–2.2)
Albumin: 4.3 g/dL (ref 3.6–4.6)
Alkaline Phosphatase: 79 IU/L (ref 39–117)
BUN/Creatinine Ratio: 26 (ref 12–28)
BUN: 22 mg/dL (ref 8–27)
Bilirubin Total: 0.4 mg/dL (ref 0.0–1.2)
CO2: 28 mmol/L (ref 20–29)
Calcium: 9.7 mg/dL (ref 8.7–10.3)
Chloride: 96 mmol/L (ref 96–106)
Creatinine, Ser: 0.84 mg/dL (ref 0.57–1.00)
GFR calc Af Amer: 73 mL/min/{1.73_m2} (ref >59)
GFR calc non Af Amer: 63 mL/min/{1.73_m2} (ref >59)
Globulin, Total: 3.2 g/dL (ref 1.5–4.5)
Glucose: 88 mg/dL (ref 65–99)
Potassium: 3.7 mmol/L (ref 3.5–5.2)
Sodium: 136 mmol/L (ref 134–144)
Total Protein: 7.5 g/dL (ref 6.0–8.5)

## 2019-08-27 LAB — MICROSCOPIC EXAMINATION: Casts: NONE SEEN /lpf

## 2019-08-27 LAB — CBC WITH DIFFERENTIAL/PLATELET
Basophils Absolute: 0.1 10*3/uL (ref 0.0–0.2)
Basos: 1 %
EOS (ABSOLUTE): 0 10*3/uL (ref 0.0–0.4)
Eos: 0 %
Hematocrit: 40.6 % (ref 34.0–46.6)
Hemoglobin: 13.2 g/dL (ref 11.1–15.9)
Immature Grans (Abs): 0 10*3/uL (ref 0.0–0.1)
Immature Granulocytes: 0 %
Lymphocytes Absolute: 3.8 10*3/uL — ABNORMAL HIGH (ref 0.7–3.1)
Lymphs: 45 %
MCH: 30.1 pg (ref 26.6–33.0)
MCHC: 32.5 g/dL (ref 31.5–35.7)
MCV: 93 fL (ref 79–97)
Monocytes Absolute: 0.7 10*3/uL (ref 0.1–0.9)
Monocytes: 9 %
Neutrophils Absolute: 3.8 10*3/uL (ref 1.4–7.0)
Neutrophils: 45 %
Platelets: 179 10*3/uL (ref 150–450)
RBC: 4.38 x10E6/uL (ref 3.77–5.28)
RDW: 12 % (ref 11.7–15.4)
WBC: 8.4 10*3/uL (ref 3.4–10.8)

## 2019-08-27 LAB — LIPID PANEL
Chol/HDL Ratio: 2.7 ratio (ref 0.0–4.4)
Cholesterol, Total: 138 mg/dL (ref 100–199)
HDL: 52 mg/dL (ref >39)
LDL Chol Calc (NIH): 67 mg/dL (ref 0–99)
Triglycerides: 103 mg/dL (ref 0–149)
VLDL Cholesterol Cal: 19 mg/dL (ref 5–40)

## 2019-08-27 LAB — HEMOGLOBIN A1C
Est. average glucose Bld gHb Est-mCnc: 117 mg/dL
Hgb A1c MFr Bld: 5.7 % — ABNORMAL HIGH (ref 4.8–5.6)

## 2019-08-27 LAB — TSH: TSH: 6.16 u[IU]/mL — ABNORMAL HIGH (ref 0.450–4.500)

## 2019-08-31 ENCOUNTER — Other Ambulatory Visit: Payer: Self-pay

## 2019-08-31 ENCOUNTER — Encounter: Payer: Self-pay | Admitting: Urology

## 2019-08-31 ENCOUNTER — Ambulatory Visit: Payer: Self-pay | Admitting: Urology

## 2019-08-31 VITALS — BP 149/57 | HR 72 | Temp 97.3°F | Wt 106.2 lb

## 2019-08-31 DIAGNOSIS — E785 Hyperlipidemia, unspecified: Secondary | ICD-10-CM

## 2019-08-31 DIAGNOSIS — E039 Hypothyroidism, unspecified: Secondary | ICD-10-CM

## 2019-08-31 DIAGNOSIS — I10 Essential (primary) hypertension: Secondary | ICD-10-CM

## 2019-08-31 MED ORDER — AMLODIPINE BESYLATE 5 MG PO TABS: 5.0000 mg | ORAL_TABLET | Freq: Every day | ORAL | 0 refills | Status: DC

## 2019-08-31 MED ORDER — LEVOTHYROXINE SODIUM 25 MCG PO TABS: 25.0000 ug | ORAL_TABLET | Freq: Every day | ORAL | 0 refills | Status: AC

## 2019-08-31 MED ORDER — SIMVASTATIN 40 MG PO TABS: 40.0000 mg | ORAL_TABLET | Freq: Every day | ORAL | 3 refills | Status: DC

## 2019-08-31 MED ORDER — HYDROCHLOROTHIAZIDE 25 MG PO TABS: 25.0000 mg | ORAL_TABLET | Freq: Every day | ORAL | 3 refills | Status: DC

## 2019-08-31 NOTE — Addendum Note (Signed)
Addended by: Adah Perl on: 08/31/2019 07:23 PM   Modules accepted: Orders

## 2019-08-31 NOTE — Progress Notes (Signed)
Patient: Melanie Stark Female    DOB: 1931/10/30   85 y.o.   MRN: [id number] Visit Date: 08/31/2019  Today's Provider: Zara Council, PA-C   No chief complaint on file.  Subjective:    HPI   No complaints at this visit.  Taking HCTZ 25 mg BID.  Complain of urinary frequency during the day.  No complaints of UTI.      Results for orders placed or performed in visit on 08/26/19  Microscopic Examination   BLD  Result Value Ref Range   WBC, UA 0-5 0 - 5 /hpf   RBC 0-2 0 - 2 /hpf   Epithelial Cells (non renal) 0-10 0 - 10 /hpf   Casts None seen None seen /lpf   Bacteria, UA Few None seen/Few  HgB A1c  Result Value Ref Range   Hgb A1c MFr Bld 5.7 (H) 4.8 - 5.6 %   Est. average glucose Bld gHb Est-mCnc 117 mg/dL  TSH  Result Value Ref Range   TSH 6.160 (H) 0.450 - 4.500 uIU/mL  Urinalysis, Routine w reflex microscopic  Result Value Ref Range   Specific Gravity, UA 1.006 1.005 - 1.030   pH, UA 8.0 (H) 5.0 - 7.5   Color, UA Yellow Yellow   Appearance Ur Clear Clear   Leukocytes,UA 3+ (A) Negative   Protein,UA Negative Negative/Trace   Glucose, UA Negative Negative   Ketones, UA Negative Negative   RBC, UA Trace (A) Negative   Bilirubin, UA Negative Negative   Urobilinogen, Ur 0.2 0.2 - 1.0 mg/dL   Nitrite, UA Negative Negative   Microscopic Examination See below:   Lipid Profile  Result Value Ref Range   Cholesterol, Total 138 100 - 199 mg/dL   Triglycerides 103 0 - 149 mg/dL   HDL 52 >39 mg/dL   VLDL Cholesterol Cal 19 5 - 40 mg/dL   LDL Chol Calc (NIH) 67 0 - 99 mg/dL   Chol/HDL Ratio 2.7 0.0 - 4.4 ratio  Comp Met (CMET)  Result Value Ref Range   Glucose 88 65 - 99 mg/dL   BUN 22 8 - 27 mg/dL   Creatinine, Ser 0.84 0.57 - 1.00 mg/dL   GFR calc non Af Amer 63 >59 mL/min/1.73   GFR calc Af Amer 73 >59 mL/min/1.73   BUN/Creatinine Ratio 26 12 - 28   Sodium 136 134 - 144 mmol/L   Potassium 3.7 3.5 - 5.2 mmol/L   Chloride 96 96 - 106 mmol/L   CO2 28 20 - 29  mmol/L   Calcium 9.7 8.7 - 10.3 mg/dL   Total Protein 7.5 6.0 - 8.5 g/dL   Albumin 4.3 3.6 - 4.6 g/dL   Globulin, Total 3.2 1.5 - 4.5 g/dL   Albumin/Globulin Ratio 1.3 1.2 - 2.2   Bilirubin Total 0.4 0.0 - 1.2 mg/dL   Alkaline Phosphatase 79 39 - 117 IU/L   AST 28 0 - 40 IU/L   ALT 17 0 - 32 IU/L  CBC w/Diff  Result Value Ref Range   WBC 8.4 3.4 - 10.8 x10E3/uL   RBC 4.38 3.77 - 5.28 x10E6/uL   Hemoglobin 13.2 11.1 - 15.9 g/dL   Hematocrit 40.6 34.0 - 46.6 %   MCV 93 79 - 97 fL   MCH 30.1 26.6 - 33.0 pg   MCHC 32.5 31.5 - 35.7 g/dL   RDW 12.0 11.7 - 15.4 %   Platelets 179 150 - 450 x10E3/uL   Neutrophils 45 Not Estab. %   Lymphs  45 Not Estab. %   Monocytes 9 Not Estab. %   Eos 0 Not Estab. %   Basos 1 Not Estab. %   Neutrophils Absolute 3.8 1.4 - 7.0 x10E3/uL   Lymphocytes Absolute 3.8 (H) 0.7 - 3.1 x10E3/uL   Monocytes Absolute 0.7 0.1 - 0.9 x10E3/uL   EOS (ABSOLUTE) 0.0 0.0 - 0.4 x10E3/uL   Basophils Absolute 0.1 0.0 - 0.2 x10E3/uL   Immature Granulocytes 0 Not Estab. %   Immature Grans (Abs) 0.0 0.0 - 0.1 x10E3/uL     No Known Allergies Previous Medications   CEPHALEXIN (KEFLEX) 500 MG CAPSULE    Take 1 capsule (500 mg total) by mouth 2 (two) times daily.   HYDROCHLOROTHIAZIDE (HYDRODIURIL) 25 MG TABLET    Take 1 tablet (25 mg total) by mouth daily. Take 1 and 1/2 tabs PO QD   SIMVASTATIN (ZOCOR) 40 MG TABLET    Take 1 tablet (40 mg total) by mouth daily.    Review of Systems  Social History   Tobacco Use  . Smoking status: Never Smoker  . Smokeless tobacco: Never Used  Substance Use Topics  . Alcohol use: No   Objective:   BP (!) 149/57   Pulse 72   Temp (!) 97.3 F (36.3 C)   Wt 106 lb 3.2 oz (48.2 kg)   LMP  (LMP Unknown)   SpO2 99%   BMI 23.81 kg/m       Physical Exam Constitutional:      Appearance: Normal appearance. She is normal weight.  HENT:     Head: Normocephalic and atraumatic.     Nose: Nose normal.     Mouth/Throat:      Mouth: Mucous membranes are moist.  Neck:     Musculoskeletal: Normal range of motion and neck supple.     Comments: Could not palpate any thyroid nodules  Neurological:     Mental Status: She is alert.         Assessment & Plan:    1. Hypothyroidism  Check TSH, T4 and T3 next Thursday   Start synthroid 25 mg daily after   2. HTN  Continue HCTZ 25 mg daily and add amlodipine 5 mg daily    3. Urinary frequency  Reassess after starting the amlodipine as HCTZ may be contributing to urinary frequency   Check UA in 6 weeks as urine pH is 8   Amadu Schlageter, PA-C   [facility]

## 2019-09-09 ENCOUNTER — Other Ambulatory Visit: Payer: Self-pay

## 2019-09-09 DIAGNOSIS — E039 Hypothyroidism, unspecified: Secondary | ICD-10-CM

## 2019-09-10 LAB — THYROID PANEL WITH TSH
Free Thyroxine Index: 1.7 (ref 1.2–4.9)
T3 Uptake Ratio: 25 % (ref 24–39)
T4, Total: 6.9 ug/dL (ref 4.5–12.0)
TSH: 5.1 u[IU]/mL — ABNORMAL HIGH (ref 0.450–4.500)

## 2019-10-14 ENCOUNTER — Other Ambulatory Visit: Payer: Self-pay

## 2019-10-14 ENCOUNTER — Ambulatory Visit: Payer: Self-pay | Admitting: Urology

## 2019-10-14 VITALS — BP 147/62 | HR 88 | Wt 108.1 lb

## 2019-10-14 DIAGNOSIS — I10 Essential (primary) hypertension: Secondary | ICD-10-CM

## 2019-10-14 DIAGNOSIS — R350 Frequency of micturition: Secondary | ICD-10-CM

## 2019-10-14 NOTE — Progress Notes (Signed)
  Patient: Melanie Stark Female    DOB: 11/28/1930   85 y.o.   MRN: [id number] Visit Date: 10/14/2019  Today's Provider: [place]   Chief Complaint  Patient presents with  . Follow-up   Subjective:    HPI  She is doing well.  Very active.    No Known Allergies Previous Medications   AMLODIPINE (NORVASC) 5 MG TABLET    Take 1 tablet (5 mg total) by mouth daily.   CEPHALEXIN (KEFLEX) 500 MG CAPSULE    Take 1 capsule (500 mg total) by mouth 2 (two) times daily.   HYDROCHLOROTHIAZIDE (HYDRODIURIL) 25 MG TABLET    Take 1 tablet (25 mg total) by mouth daily. Take 1 and 1/2 tabs PO QD   LEVOTHYROXINE (SYNTHROID) 25 MCG TABLET    Take 1 tablet (25 mcg total) by mouth daily before breakfast.   SIMVASTATIN (ZOCOR) 40 MG TABLET    Take 1 tablet (40 mg total) by mouth daily.    Review of Systems  Social History   Tobacco Use  . Smoking status: Never Smoker  . Smokeless tobacco: Never Used  Substance Use Topics  . Alcohol use: No   Objective:   BP (!) 147/62 (BP Location: Left Arm, Patient Position: Sitting)   Pulse 88   Wt 108 lb 1.6 oz (49 kg)   LMP  (LMP Unknown)   SpO2 97%   BMI 24.24 kg/m   Physical Exam Constitutional:  Well nourished. Alert and oriented, No acute distress. HEENT: [place] AT, moist mucus membranes.  Trachea midline, no masses. Cardiovascular: No clubbing, cyanosis, or edema. Respiratory: Normal respiratory effort, no increased work of breathing. Neurologic: Grossly intact, no focal deficits, moving all 4 extremities. Psychiatric: Normal mood and affect.     Assessment & Plan:   1. HTN Continue HCTZ 25 mg BID and Amlodipine 5 mg daily  2. Urinary frequency Check UA  [facility]   [facility]

## 2019-10-15 LAB — URINALYSIS, COMPLETE
Bilirubin, UA: NEGATIVE
Glucose, UA: NEGATIVE
Ketones, UA: NEGATIVE
Nitrite, UA: NEGATIVE
Protein,UA: NEGATIVE
Specific Gravity, UA: 1.008 (ref 1.005–1.030)
Urobilinogen, Ur: 0.2 mg/dL (ref 0.2–1.0)
pH, UA: 7 (ref 5.0–7.5)

## 2019-10-15 LAB — MICROSCOPIC EXAMINATION
Bacteria, UA: NONE SEEN
Casts: NONE SEEN /lpf
Epithelial Cells (non renal): NONE SEEN /hpf (ref 0–10)

## 2020-01-09 ENCOUNTER — Other Ambulatory Visit: Payer: Self-pay | Admitting: Urology

## 2020-01-12 ENCOUNTER — Other Ambulatory Visit: Payer: Self-pay

## 2020-01-12 MED ORDER — AMLODIPINE BESYLATE 5 MG PO TABS: 5.0000 mg | ORAL_TABLET | Freq: Every day | ORAL | 1 refills | Status: DC

## 2020-02-17 ENCOUNTER — Other Ambulatory Visit: Payer: Self-pay

## 2020-02-17 ENCOUNTER — Ambulatory Visit: Payer: Medicaid Other | Admitting: Urology

## 2020-02-17 VITALS — BP 145/65 | Wt 108.0 lb

## 2020-02-17 DIAGNOSIS — E785 Hyperlipidemia, unspecified: Secondary | ICD-10-CM

## 2020-02-17 DIAGNOSIS — E039 Hypothyroidism, unspecified: Secondary | ICD-10-CM

## 2020-02-17 DIAGNOSIS — I10 Essential (primary) hypertension: Secondary | ICD-10-CM

## 2020-02-17 MED ORDER — HYDROCHLOROTHIAZIDE 25 MG PO TABS: 25.0000 mg | ORAL_TABLET | Freq: Every day | ORAL | 0 refills | Status: AC

## 2020-02-17 MED ORDER — AMLODIPINE BESYLATE 5 MG PO TABS: 5.0000 mg | ORAL_TABLET | Freq: Every day | ORAL | 0 refills | Status: AC

## 2020-02-17 MED ORDER — SIMVASTATIN 40 MG PO TABS: 40.0000 mg | ORAL_TABLET | Freq: Every day | ORAL | 3 refills | Status: AC

## 2020-02-17 NOTE — Progress Notes (Signed)
  Patient: Melanie Stark Female    DOB: 1931/02/07   86 y.o.   MRN: [id number] Visit Date: 02/17/2020  Today's Provider: [facility]   Chief Complaint  Patient presents with  . Follow-up   Subjective:    HPI  HTN  At goal   HLD  Labs pending  Subclinical hypothyroidism  Labs pending     No Known Allergies Previous Medications   AMLODIPINE (NORVASC) 5 MG TABLET    Take 1 tablet (5 mg total) by mouth daily.   HYDROCHLOROTHIAZIDE (HYDRODIURIL) 25 MG TABLET    Take 1 tablet (25 mg total) by mouth daily. Take 1 and 1/2 tabs PO QD   LEVOTHYROXINE (SYNTHROID) 25 MCG TABLET    Take 1 tablet (25 mcg total) by mouth daily before breakfast.   SIMVASTATIN (ZOCOR) 40 MG TABLET    Take 1 tablet (40 mg total) by mouth daily.    Review of Systems  Social History   Tobacco Use  . Smoking status: Never Smoker  . Smokeless tobacco: Never Used  Substance Use Topics  . Alcohol use: No   Objective:   BP (!) 145/65 Comment: self reported  Wt 108 lb (49 kg) Comment: self reported  LMP  (LMP Unknown)   BMI 24.21 kg/m   Physical Exam      Assessment & Plan:    1. Hyperlipidemia, unspecified hyperlipidemia type - simvastatin (ZOCOR) 40 MG tablet; Take 1 tablet (40 mg total) by mouth daily.  Dispense: 90 tablet; Refill: 3 - Lipid panel  2. Essential hypertension - hydrochlorothiazide (HYDRODIURIL) 25 MG tablet; Take 1 tablet (25 mg total) by mouth daily. Take 1 and 1/2 tabs PO QD Pt is taking 2 a day.  One in the morning and one at night  Dispense: 180 tablet; Refill: 0 - Comprehensive metabolic panel - CBC with Differential/Platelet - Hemoglobin A1c  3. Hypothyroidism, unspecified type - Thyroid Panel With TSH   [facility]   [facility]

## 2020-02-18 LAB — LIPID PANEL
Chol/HDL Ratio: 2.9 ratio (ref 0.0–4.4)
Cholesterol, Total: 131 mg/dL (ref 100–199)
HDL: 45 mg/dL (ref >39)
LDL Chol Calc (NIH): 57 mg/dL (ref 0–99)
Triglycerides: 172 mg/dL — ABNORMAL HIGH (ref 0–149)
VLDL Cholesterol Cal: 29 mg/dL (ref 5–40)

## 2020-02-18 LAB — THYROID PANEL WITH TSH
Free Thyroxine Index: 1.4 (ref 1.2–4.9)
T3 Uptake Ratio: 23 % — ABNORMAL LOW (ref 24–39)
T4, Total: 6.3 ug/dL (ref 4.5–12.0)
TSH: 5.62 u[IU]/mL — ABNORMAL HIGH (ref 0.450–4.500)

## 2020-02-18 LAB — CBC WITH DIFFERENTIAL/PLATELET
Basophils Absolute: 0.1 10*3/uL (ref 0.0–0.2)
Basos: 1 %
EOS (ABSOLUTE): 0 10*3/uL (ref 0.0–0.4)
Eos: 0 %
Hematocrit: 41.7 % (ref 34.0–46.6)
Hemoglobin: 13.9 g/dL (ref 11.1–15.9)
Immature Grans (Abs): 0 10*3/uL (ref 0.0–0.1)
Immature Granulocytes: 0 %
Lymphocytes Absolute: 2.8 10*3/uL (ref 0.7–3.1)
Lymphs: 41 %
MCH: 29.9 pg (ref 26.6–33.0)
MCHC: 33.3 g/dL (ref 31.5–35.7)
MCV: 90 fL (ref 79–97)
Monocytes Absolute: 0.7 10*3/uL (ref 0.1–0.9)
Monocytes: 10 %
Neutrophils Absolute: 3.4 10*3/uL (ref 1.4–7.0)
Neutrophils: 48 %
Platelets: 155 10*3/uL (ref 150–450)
RBC: 4.65 x10E6/uL (ref 3.77–5.28)
RDW: 12.4 % (ref 11.7–15.4)
WBC: 6.9 10*3/uL (ref 3.4–10.8)

## 2020-02-18 LAB — COMPREHENSIVE METABOLIC PANEL
ALT: 20 IU/L (ref 0–32)
AST: 29 IU/L (ref 0–40)
Albumin/Globulin Ratio: 1.5 (ref 1.2–2.2)
Albumin: 4.5 g/dL (ref 3.6–4.6)
Alkaline Phosphatase: 91 IU/L (ref 39–117)
BUN/Creatinine Ratio: 19 (ref 12–28)
BUN: 20 mg/dL (ref 8–27)
Bilirubin Total: 0.3 mg/dL (ref 0.0–1.2)
CO2: 27 mmol/L (ref 20–29)
Calcium: 9.5 mg/dL (ref 8.7–10.3)
Chloride: 101 mmol/L (ref 96–106)
Creatinine, Ser: 1.03 mg/dL — ABNORMAL HIGH (ref 0.57–1.00)
GFR calc Af Amer: 57 mL/min/{1.73_m2} — ABNORMAL LOW (ref >59)
GFR calc non Af Amer: 49 mL/min/{1.73_m2} — ABNORMAL LOW (ref >59)
Globulin, Total: 3 g/dL (ref 1.5–4.5)
Glucose: 98 mg/dL (ref 65–99)
Potassium: 4 mmol/L (ref 3.5–5.2)
Sodium: 140 mmol/L (ref 134–144)
Total Protein: 7.5 g/dL (ref 6.0–8.5)

## 2020-02-18 LAB — HEMOGLOBIN A1C
Est. average glucose Bld gHb Est-mCnc: 137 mg/dL
Hgb A1c MFr Bld: 6.4 % — ABNORMAL HIGH (ref 4.8–5.6)

## 2020-02-22 ENCOUNTER — Telehealth: Payer: Self-pay

## 2020-02-22 NOTE — Telephone Encounter (Signed)
Called pt at 2:00 on 4/20 using interpreter service. Pt did not pick up, and instructions were left to call back clinic to schedule appt in 6 mos - [place]

## 2020-03-01 ENCOUNTER — Telehealth: Payer: Self-pay

## 2020-03-01 NOTE — Telephone Encounter (Signed)
Melanie Stark from [facility] pharmacy called requesting clarification on HCTZ prescription instructions that was sent on 02/17/20.  I spoke with Lanora Manis. She recommends patient take 25mg  once a day.  Called in new directions to [facility] at [facility].

## 2020-03-02 ENCOUNTER — Telehealth: Payer: Self-pay

## 2020-03-02 NOTE — Telephone Encounter (Signed)
Called pt on 4/29 at 1:47pm and LVM about scheduling her 6 month f/u appt

## 2020-03-16 ENCOUNTER — Telehealth: Payer: Self-pay | Admitting: Internal Medicine

## 2020-03-16 NOTE — Telephone Encounter (Signed)
Called pt at 9:14am 5/13 to get clarification on where she is receiving primary care. I was able to leave a voicemail but pt will need to be reached out to again to ensure she is following [facility] protocol

## 2020-08-10 ENCOUNTER — Ambulatory Visit: Payer: Medicaid Other
